# Patient Record
Sex: Male | Born: 1995 | Marital: Single | Smoking: Never smoker
Health system: Southern US, Community
[De-identification: ages and names within clinical notes are randomized; demographics above are authoritative.]

## PROBLEM LIST (undated history)

## (undated) DIAGNOSIS — E119 Type 2 diabetes mellitus without complications: Secondary | ICD-10-CM

## (undated) DIAGNOSIS — K760 Fatty (change of) liver, not elsewhere classified: Secondary | ICD-10-CM

## (undated) DIAGNOSIS — B948 Sequelae of other specified infectious and parasitic diseases: Secondary | ICD-10-CM

## (undated) DIAGNOSIS — Z98811 Dental restoration status: Secondary | ICD-10-CM

## (undated) DIAGNOSIS — T8859XA Other complications of anesthesia, initial encounter: Secondary | ICD-10-CM

## (undated) DIAGNOSIS — R479 Unspecified speech disturbances: Secondary | ICD-10-CM

## (undated) DIAGNOSIS — J45909 Unspecified asthma, uncomplicated: Secondary | ICD-10-CM

## (undated) DIAGNOSIS — R6250 Unspecified lack of expected normal physiological development in childhood: Secondary | ICD-10-CM

## (undated) DIAGNOSIS — Q999 Chromosomal abnormality, unspecified: Secondary | ICD-10-CM

## (undated) DIAGNOSIS — D8989 Other specified disorders involving the immune mechanism, not elsewhere classified: Secondary | ICD-10-CM

## (undated) DIAGNOSIS — F909 Attention-deficit hyperactivity disorder, unspecified type: Secondary | ICD-10-CM

## (undated) DIAGNOSIS — T4145XA Adverse effect of unspecified anesthetic, initial encounter: Secondary | ICD-10-CM

## (undated) DIAGNOSIS — Z9109 Other allergy status, other than to drugs and biological substances: Secondary | ICD-10-CM

## (undated) HISTORY — PX: DENTAL REHABILITATION: SHX1449

---
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
## 2020-03-19 ENCOUNTER — Ambulatory Visit: Payer: Medicaid Other | Attending: Internal Medicine

## 2020-03-19 DIAGNOSIS — Z23 Encounter for immunization: Secondary | ICD-10-CM

## 2020-03-19 NOTE — Progress Notes (Signed)
   Covid-19 Vaccination Clinic  Name:  Denzell Brungard    MRN: [id number] DOB: [birth year]  03/19/2020  Mr. Chatman-Quinn was observed post Covid-19 immunization for 15 minutes without incident. He was provided with Vaccine Information Sheet and instruction to access the V-Safe system.   Mr. Bogusz was instructed to call 911 with any severe reactions post vaccine: Marland Kitchen Difficulty breathing  . Swelling of face and throat  . A fast heartbeat  . A bad rash all over body  . Dizziness and weakness   Immunizations Administered    Name Date Dose VIS Date Route   Pfizer COVID-19 Vaccine 03/19/2020  3:54 PM 0.3 mL 01/17/2019 Intramuscular   Manufacturer: [place]   Lot: [id number]   [place]: [id number]

## 2020-04-09 ENCOUNTER — Ambulatory Visit: Payer: Self-pay

## 2020-04-12 ENCOUNTER — Ambulatory Visit: Payer: Medicaid Other | Attending: Internal Medicine

## 2020-04-12 DIAGNOSIS — Z23 Encounter for immunization: Secondary | ICD-10-CM

## 2020-04-12 NOTE — Progress Notes (Signed)
   Covid-19 Vaccination Clinic  Name:  Cristian [name]    MRN: [id number] DOB: 1996-01-19  04/12/2020  Cristian [name] was observed post Covid-19 immunization for 15 minutes without incident. He was provided with Vaccine Information Sheet and instruction to access the V-Safe system.   Cristian [name] was instructed to call 911 with any severe reactions post vaccine: Marland Kitchen Difficulty breathing  . Swelling of face and throat  . A fast heartbeat  . A bad rash all over body  . Dizziness and weakness   Immunizations Administered    Name Date Dose VIS Date Route   Pfizer COVID-19 Vaccine 04/12/2020 11:45 AM 0.3 mL 01/17/2019 Intramuscular   Manufacturer: [place]   Lot: [id number]   [place]: [id number]

## 2020-05-23 DIAGNOSIS — Z419 Encounter for procedure for purposes other than remedying health state, unspecified: Secondary | ICD-10-CM | POA: Diagnosis not present

## 2020-06-04 DIAGNOSIS — F901 Attention-deficit hyperactivity disorder, predominantly hyperactive type: Secondary | ICD-10-CM | POA: Diagnosis not present

## 2020-06-23 DIAGNOSIS — Z419 Encounter for procedure for purposes other than remedying health state, unspecified: Secondary | ICD-10-CM | POA: Diagnosis not present

## 2020-07-24 DIAGNOSIS — Z419 Encounter for procedure for purposes other than remedying health state, unspecified: Secondary | ICD-10-CM | POA: Diagnosis not present

## 2020-08-23 DIAGNOSIS — Z419 Encounter for procedure for purposes other than remedying health state, unspecified: Secondary | ICD-10-CM | POA: Diagnosis not present

## 2020-09-12 ENCOUNTER — Ambulatory Visit
Admission: EM | Admit: 2020-09-12 | Discharge: 2020-09-12 | Disposition: A | Discharge status: Home / Self Care | Payer: Medicaid Other | Attending: Family Medicine | Admitting: Family Medicine

## 2020-09-12 DIAGNOSIS — R300 Dysuria: Secondary | ICD-10-CM | POA: Diagnosis not present

## 2020-09-12 DIAGNOSIS — R1111 Vomiting without nausea: Secondary | ICD-10-CM | POA: Insufficient documentation

## 2020-09-12 DIAGNOSIS — R1032 Left lower quadrant pain: Secondary | ICD-10-CM | POA: Insufficient documentation

## 2020-09-12 LAB — POCT URINALYSIS DIP (MANUAL ENTRY)
Glucose, UA: NEGATIVE mg/dL
Leukocytes, UA: NEGATIVE
Nitrite, UA: NEGATIVE
Spec Grav, UA: 1.025 (ref 1.010–1.025)
Urobilinogen, UA: 0.2 E.U./dL
pH, UA: 6 (ref 5.0–8.0)

## 2020-09-12 MED ORDER — METHOCARBAMOL 500 MG PO TABS
500.0000 mg | ORAL_TABLET | Freq: Two times a day (BID) | ORAL | 0 refills | Status: DC
Start: 2020-09-12 — End: 2024-10-13

## 2020-09-12 MED ORDER — IBUPROFEN 600 MG PO TABS
600.0000 mg | ORAL_TABLET | Freq: Four times a day (QID) | ORAL | 0 refills | Status: AC | PRN
Start: 2020-09-12 — End: ?

## 2020-09-12 MED ORDER — TAMSULOSIN HCL 0.4 MG PO CAPS: 0.4000 mg | ORAL_CAPSULE | Freq: Every day | ORAL | 0 refills | Status: DC

## 2020-09-12 NOTE — Discharge Instructions (Addendum)
There is evidence of blood in the urine, this could indicate a kidney stone.  I have sent in flomax to take once a day  I have sent in ibuprofen to take every 4-6 hours as needed for pain  I have sent in Robaxin as needed for muscle spasms  Push fluids and get some rest.  Follow up with this office or with primary care as needed  I would continue with the stool softener, to help relieve some abdominal pressure.    We will culture the urine and be in touch with abnormal results that need further treatment  Follow up in the ER for acute worsening symptoms

## 2020-09-12 NOTE — ED Triage Notes (Signed)
Patient presents to Urgent Care with complaints of left sided abdominal pain since about a week ago. Patient's mother gave him a stool softener and it seemed to help. Past couple days the pain has come back but is more in the left side of his back and in his left groin, vomiting started this morning as well as some urinary burning. Pt is autistic, accompanied by mother.

## 2020-09-12 NOTE — ED Provider Notes (Signed)
[facility]   CC: UTI  SUBJECTIVE:  Ryan Ogborn is a 24 y.o. male who complains of urinary frequency, urgency and dysuria for the past day. Patient denies a precipitating event, recent sexual encounter, excessive caffeine intake. Localizes the pain to the lower left abdomen.  Pain is intermittent, patient cannot describe. Has hx autism. Has taken a stool softener with some pain relief. Has not tried miralax or other medications. Reports an episode of vomiting this morning as well. None since. Denies pain at this time. Symptoms are made worse with urination. Denies fever, chills, nausea, vomiting, flank pain, abnormal vaginal discharge or bleeding, hematuria.    LMP: No LMP for male patient.  ROS: As in HPI.  All other pertinent ROS negative.     Past Medical History:  Diagnosis Date  . ADHD (attention deficit hyperactivity disorder)   . Asthma   . Chromosomal abnormality   . Complication of anesthesia    Gases do not work, Fentanyl overly sedates.    . Dental crown present    top right  . Developmental delay   . Environmental allergies   . Fatty liver disease, nonalcoholic   . 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 Speech impediment    Past Surgical History:  Procedure Laterality Date  . DENTAL REHABILITATION    . MASS EXCISION Right 08/23/2015   Procedure: MINOR EXCISION OF MASS RIGHT FOREHEAD;  Surgeon: Beverly Gust, MD;  Location: [facility];  Service: ENT;  Laterality: Right;   No Known Allergies No current facility-administered medications on file prior to encounter.   Current Outpatient Medications on File Prior to Encounter  Medication Sig Dispense Refill  . amphetamine-dextroamphetamine (ADDERALL) 20 MG tablet Take 20 mg by mouth daily. 40 mg in AM, 20 mg around noon    . atomoxetine (STRATTERA) 60 MG capsule Take 60 mg by mouth daily. PM    . cetirizine  (ZYRTEC) 10 MG tablet Take 10 mg by mouth daily. PM    . Cholecalciferol (VITAMIN D PO) Take by mouth.    . metFORMIN (GLUCOPHAGE) 500 MG tablet Take by mouth 2 (two) times daily with a meal.     Social History   Socioeconomic History  . Marital status: Single    Spouse name: Not on file  . Number of children: Not on file  . Years of education: Not on file  . Highest education level: Not on file  Occupational History  . Not on file  Tobacco Use  . Smoking status: Never Smoker  . Smokeless tobacco: Never Used  . Tobacco comment: Mother smokes outside the home.  Substance and Sexual Activity  . Alcohol use: No  . Drug use: Not on file  . Sexual activity: Not on file  Other Topics Concern  . Not on file  Social History Narrative  . Not on file   Social Determinants of Health   Financial Resource Strain:   . Difficulty of Paying Living Expenses: Not on file  Food Insecurity:   . Worried About Charity fundraiser in the Last Year: Not on file  . Ran Out of Food in the Last Year: Not on file  Transportation Needs:   . Lack of Transportation (Medical): Not on file  . Lack of Transportation (Non-Medical): Not on file  Physical Activity:   . Days of Exercise per Week: Not on file  . Minutes of Exercise per Session: Not on file  Stress:   . Feeling of Stress : Not on file  Social Connections:   . Frequency of Communication with Friends and Family: Not on file  . Frequency of Social Gatherings with Friends and Family: Not on file  . Attends Religious Services: Not on file  . Active Member of Clubs or Organizations: Not on file  . Attends Archivist Meetings: Not on file  . Marital Status: Not on file  Intimate Partner Violence:   . Fear of Current or Ex-Partner: Not on file  . Emotionally Abused: Not on file  . Physically Abused: Not on file  . Sexually Abused: Not on file   Family History  Problem Relation Age of Onset  . Hypertension Mother      OBJECTIVE:  Vitals:   09/12/20 0932  BP: 136/83  Pulse: 86  Resp: 16  Temp: 97.8 F (36.6 C)  TempSrc: Oral  SpO2: 96%   General appearance: AOx3 in no acute distress HEENT: NCAT. Oropharynx clear.  Lungs: clear to auscultation bilaterally without adventitious breath sounds Heart: regular rate and rhythm. Radial pulses 2+ symmetrical bilaterally Abdomen: soft; non-distended; no tenderness; bowel sounds present; no guarding or rebound tenderness Back: no CVA tenderness Extremities: no edema; symmetrical with no gross deformities Skin: warm and dry Neurologic: Ambulates from chair to exam table without difficulty Psychological: alert and cooperative; normal mood and affect  Labs Reviewed  POCT URINALYSIS DIP (MANUAL ENTRY) - Abnormal; Notable for the following components:      Result Value   Bilirubin, UA small (*)    Ketones, POC UA small (15) (*)    Blood, UA large (*)    Protein Ur, POC trace (*)    All other components within normal limits  URINE CULTURE    ASSESSMENT & PLAN:  1. Left lower quadrant abdominal pain   2. Dysuria   3. Vomiting without nausea, intractability of vomiting not specified, unspecified vomiting type     Meds ordered this encounter  Medications  . tamsulosin (FLOMAX) 0.4 MG CAPS capsule    Sig: Take 1 capsule (0.4 mg total) by mouth daily.    Dispense:  10 capsule    Refill:  0    Order Specific Question:   Supervising Provider    Answer:   Chase Picket [id number]  . ibuprofen (ADVIL) 600 MG tablet    Sig: Take 1 tablet (600 mg total) by mouth every 6 (six) hours as needed.    Dispense:  30 tablet    Refill:  0    Order Specific Question:   Supervising Provider    Answer:   Chase Picket [id number]  . methocarbamol (ROBAXIN) 500 MG tablet    Sig: Take 1 tablet (500 mg total) by mouth 2 (two) times daily.    Dispense:  20 tablet    Refill:  0    Order Specific Question:   Supervising Provider    Answer:   Chase Picket [id number]   UA in office + for blood, ketones, bili Urine culture sent  Likely kidney stone Prescribed flomax Prescribed robaxin Prescribed ibuprofen Continue with stool softener to help relieve pressure in the abdomen We will call you with abnormal results that need further treatment Push fluids and get plenty of rest Take antibiotic as directed and to completion Take pyridium as prescribed and as needed for symptomatic relief Follow up with PCP if symptoms persists Return here or go to ER if you have any new  or worsening symptoms such as fever, worsening abdominal pain, nausea/vomiting, flank pain  Outlined signs and symptoms indicating need for more acute intervention Patient verbalized understanding After Visit Summary given     Faustino Congress, NP 09/12/20 1002

## 2020-09-14 LAB — URINE CULTURE: Culture: NO GROWTH

## 2020-09-23 DIAGNOSIS — Z419 Encounter for procedure for purposes other than remedying health state, unspecified: Secondary | ICD-10-CM | POA: Diagnosis not present

## 2020-10-23 DIAGNOSIS — Z419 Encounter for procedure for purposes other than remedying health state, unspecified: Secondary | ICD-10-CM | POA: Diagnosis not present

## 2020-11-23 DIAGNOSIS — Z419 Encounter for procedure for purposes other than remedying health state, unspecified: Secondary | ICD-10-CM | POA: Diagnosis not present

## 2020-11-25 DIAGNOSIS — K047 Periapical abscess without sinus: Secondary | ICD-10-CM | POA: Diagnosis not present

## 2020-11-25 DIAGNOSIS — H9201 Otalgia, right ear: Secondary | ICD-10-CM | POA: Diagnosis not present

## 2020-12-24 DIAGNOSIS — Z419 Encounter for procedure for purposes other than remedying health state, unspecified: Secondary | ICD-10-CM | POA: Diagnosis not present

## 2021-01-21 DIAGNOSIS — Z419 Encounter for procedure for purposes other than remedying health state, unspecified: Secondary | ICD-10-CM | POA: Diagnosis not present

## 2021-02-21 DIAGNOSIS — Z419 Encounter for procedure for purposes other than remedying health state, unspecified: Secondary | ICD-10-CM | POA: Diagnosis not present

## 2021-03-06 ENCOUNTER — Other Ambulatory Visit: Payer: Self-pay

## 2021-03-06 ENCOUNTER — Ambulatory Visit
Admission: RE | Admit: 2021-03-06 | Discharge: 2021-03-06 | Disposition: A | Discharge status: Home / Self Care | Payer: Medicaid Other | Source: Ambulatory Visit | Attending: Emergency Medicine | Admitting: Emergency Medicine

## 2021-03-06 VITALS — BP 148/97 | HR 105 | Temp 98.0°F | Resp 18

## 2021-03-06 DIAGNOSIS — J302 Other seasonal allergic rhinitis: Secondary | ICD-10-CM

## 2021-03-06 DIAGNOSIS — Z1152 Encounter for screening for COVID-19: Secondary | ICD-10-CM | POA: Diagnosis not present

## 2021-03-06 NOTE — Discharge Instructions (Addendum)
Take over-the-counter Zyrtec and use over-the-counter Flonase nasal spray as needed for allergy symptoms.    Follow up with your primary care provider if your symptoms are not improving.

## 2021-03-06 NOTE — ED Triage Notes (Signed)
Pt presents with cough and chest congestion x 1 week.  Allergies have also kicked in the last few days, making symptoms worse.  No fever.  Cough is dry.  Requesting COVID test.

## 2021-03-06 NOTE — ED Provider Notes (Signed)
[name]    CSN: [id number] Arrival date & time: 03/06/21  1409      History   Chief Complaint Chief Complaint  Patient presents with  . Cough    HPI Cristian [name] is a 25 y.o. male.   Accompanied by his mother, patient presents with 1 week history of nonproductive cough and chest congestion.  No fever, chills, rash, sore throat, shortness of breath, vomiting, diarrhea, or other symptoms.  OTC treatments attempted at home.  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  The history is provided by the patient and a parent.    Past Medical History:  Diagnosis Date  . ADHD (attention deficit hyperactivity disorder)   . Asthma   . Chromosomal abnormality   . Complication of anesthesia    Gases do not work, Fentanyl overly sedates.    . Dental crown present    top right  . Developmental delay   . Environmental allergies   . Fatty liver disease, nonalcoholic   . 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 Speech impediment     There are no problems to display for this patient.   Past Surgical History:  Procedure Laterality Date  . DENTAL REHABILITATION    . MASS EXCISION Right 08/23/2015   Procedure: MINOR EXCISION OF MASS RIGHT FOREHEAD;  Surgeon: Beverly Gust, MD;  Location: [facility];  Service: ENT;  Laterality: Right;       Home Medications    Prior to Admission medications   Medication Sig Start Date End Date Taking? Authorizing Provider  amphetamine-dextroamphetamine (ADDERALL) 20 MG tablet Take 20 mg by mouth daily. 40 mg in AM, 20 mg around noon   Yes [provider]  cetirizine (ZYRTEC) 10 MG tablet Take 10 mg by mouth daily. PM   Yes [provider]  Cholecalciferol (VITAMIN D  PO) Take by mouth.   Yes [provider]  ibuprofen (ADVIL) 600 MG tablet Take 1 tablet (600 mg total) by mouth every 6 (six) hours as needed. 09/12/20  Yes Faustino Congress, NP  metFORMIN (GLUCOPHAGE) 500 MG tablet Take by mouth 2 (two) times daily with a meal.   Yes [provider]  atomoxetine (STRATTERA) 60 MG capsule Take 60 mg by mouth daily. PM    [provider]  methocarbamol (ROBAXIN) 500 MG tablet Take 1 tablet (500 mg total) by mouth 2 (two) times daily. 09/12/20   Faustino Congress, NP  tamsulosin (FLOMAX) 0.4 MG CAPS capsule Take 1 capsule (0.4 mg total) by mouth daily. 09/12/20   Faustino Congress, NP    Family History Family History  Problem Relation Age of Onset  . Hypertension Mother     Social History Social History   Tobacco Use  . Smoking status: Never Smoker  . Smokeless tobacco: Never Used  . Tobacco comment: Mother smokes outside the home.  Substance Use Topics  . Alcohol use: No  . Drug use: Never     Allergies   Patient has no known allergies.   Review of Systems Review of Systems  Constitutional: Negative for chills and fever.  HENT: Positive for congestion. Negative for ear pain and sore throat.   Eyes: Negative for pain and visual disturbance.  Respiratory: Positive for cough. Negative for shortness of breath.   Cardiovascular: Negative for chest pain and palpitations.  Gastrointestinal: Negative for abdominal pain,  diarrhea and vomiting.  Genitourinary: Negative for dysuria and hematuria.  Musculoskeletal: Negative for arthralgias and back pain.  Skin: Negative for color change and rash.  Neurological: Negative for seizures and syncope.  All other systems reviewed and are negative.    Physical Exam Triage Vital Signs ED Triage Vitals  Enc Vitals Group     BP 03/06/21 1423 (!) 148/97     Pulse Rate 03/06/21 1423 (!) 105     Resp 03/06/21 1423 18     Temp 03/06/21 1423 98 F (36.7 C)     Temp Source  03/06/21 1423 Oral     SpO2 03/06/21 1423 98 %     Weight --      Height --      Head Circumference --      Peak Flow --      Pain Score 03/06/21 1427 0     Pain Loc --      Pain Edu? --      Excl. in College? --    No data found.  Updated Vital Signs BP (!) 148/97 (BP Location: Left Arm)   Pulse (!) 105   Temp 98 F (36.7 C) (Oral)   Resp 18   SpO2 98%   Visual Acuity Right Eye Distance:   Left Eye Distance:   Bilateral Distance:    Right Eye Near:   Left Eye Near:    Bilateral Near:     Physical Exam Vitals and nursing note reviewed.  Constitutional:      General: He is not in acute distress.    Appearance: He is well-developed. He is obese.  HENT:     Head: Normocephalic and atraumatic.     Right Ear: Tympanic membrane normal.     Left Ear: Tympanic membrane normal.     Nose: Nose normal.     Mouth/Throat:     Mouth: Mucous membranes are moist.     Pharynx: Oropharynx is clear.  Eyes:     Conjunctiva/sclera: Conjunctivae normal.  Cardiovascular:     Rate and Rhythm: Normal rate and regular rhythm.     Heart sounds: Normal heart sounds.  Pulmonary:     Effort: Pulmonary effort is normal. No respiratory distress.     Breath sounds: Normal breath sounds.  Abdominal:     Palpations: Abdomen is soft.     Tenderness: There is no abdominal tenderness.  Musculoskeletal:     Cervical back: Neck supple.  Skin:    General: Skin is warm and dry.  Neurological:     Mental Status: He is alert. Mental status is at baseline.  Psychiatric:        Mood and Affect: Mood normal.        Behavior: Behavior normal.      UC Treatments / Results  Labs (all labs ordered are listed, but only abnormal results are displayed) Labs Reviewed  NOVEL CORONAVIRUS, NAA    EKG   Radiology No results found.  Procedures Procedures (including critical care time)  Medications Ordered in UC Medications - No data to display  Initial Impression / Assessment and Plan / UC  Course  I have reviewed the triage vital signs and the nursing notes.  Pertinent labs & imaging results that were available during my care of the patient were reviewed by me and considered in my medical decision making (see chart for details).   Seasonal allergies.  Screening for COVID-19.  Instructed mother and patient to use Zyrtec and  Flonase as needed for seasonal allergy symptoms.  PCR COVID pending.  Instructed patient to self quarantine until the test result is back.  Instructed patient and his mother to follow-up with his PCP if his symptoms are not improving.  They agree to plan of care.   Final Clinical Impressions(s) / UC Diagnoses   Final diagnoses:  Encounter for screening for COVID-19  Seasonal allergies     Discharge Instructions     Take over-the-counter Zyrtec and use over-the-counter Flonase nasal spray as needed for allergy symptoms.    Follow up with your primary care provider if your symptoms are not improving.        ED Prescriptions    None     PDMP not reviewed this encounter.   Sharion Balloon, NP 03/06/21 1455

## 2021-03-08 LAB — SARS-COV-2, NAA 2 DAY TAT

## 2021-03-08 LAB — NOVEL CORONAVIRUS, NAA: SARS-CoV-2, NAA: NOT DETECTED

## 2021-03-23 DIAGNOSIS — Z419 Encounter for procedure for purposes other than remedying health state, unspecified: Secondary | ICD-10-CM | POA: Diagnosis not present

## 2021-04-07 DIAGNOSIS — R0981 Nasal congestion: Secondary | ICD-10-CM | POA: Diagnosis not present

## 2021-04-07 DIAGNOSIS — R053 Chronic cough: Secondary | ICD-10-CM | POA: Diagnosis not present

## 2021-04-07 DIAGNOSIS — J4521 Mild intermittent asthma with (acute) exacerbation: Secondary | ICD-10-CM | POA: Diagnosis not present

## 2021-04-18 DIAGNOSIS — J4530 Mild persistent asthma, uncomplicated: Secondary | ICD-10-CM | POA: Diagnosis not present

## 2021-04-18 DIAGNOSIS — Z09 Encounter for follow-up examination after completed treatment for conditions other than malignant neoplasm: Secondary | ICD-10-CM | POA: Diagnosis not present

## 2021-04-23 DIAGNOSIS — Z419 Encounter for procedure for purposes other than remedying health state, unspecified: Secondary | ICD-10-CM | POA: Diagnosis not present

## 2021-05-08 DIAGNOSIS — R6250 Unspecified lack of expected normal physiological development in childhood: Secondary | ICD-10-CM | POA: Diagnosis not present

## 2021-05-08 DIAGNOSIS — Z0000 Encounter for general adult medical examination without abnormal findings: Secondary | ICD-10-CM | POA: Diagnosis not present

## 2021-05-08 DIAGNOSIS — Z6841 Body Mass Index (BMI) 40.0 and over, adult: Secondary | ICD-10-CM | POA: Diagnosis not present

## 2021-05-08 DIAGNOSIS — J4530 Mild persistent asthma, uncomplicated: Secondary | ICD-10-CM | POA: Diagnosis not present

## 2021-05-08 DIAGNOSIS — J309 Allergic rhinitis, unspecified: Secondary | ICD-10-CM | POA: Diagnosis not present

## 2021-05-08 DIAGNOSIS — Z713 Dietary counseling and surveillance: Secondary | ICD-10-CM | POA: Diagnosis not present

## 2021-05-08 DIAGNOSIS — F902 Attention-deficit hyperactivity disorder, combined type: Secondary | ICD-10-CM | POA: Diagnosis not present

## 2021-05-23 DIAGNOSIS — Z419 Encounter for procedure for purposes other than remedying health state, unspecified: Secondary | ICD-10-CM | POA: Diagnosis not present

## 2021-06-23 DIAGNOSIS — Z419 Encounter for procedure for purposes other than remedying health state, unspecified: Secondary | ICD-10-CM | POA: Diagnosis not present

## 2021-07-19 ENCOUNTER — Other Ambulatory Visit: Payer: Self-pay

## 2021-07-19 ENCOUNTER — Ambulatory Visit
Admission: EM | Admit: 2021-07-19 | Discharge: 2021-07-19 | Disposition: A | Discharge status: Home / Self Care | Payer: Medicaid Other | Attending: Emergency Medicine | Admitting: Emergency Medicine

## 2021-07-19 ENCOUNTER — Ambulatory Visit (INDEPENDENT_AMBULATORY_CARE_PROVIDER_SITE_OTHER): Payer: Medicaid Other

## 2021-07-19 DIAGNOSIS — R059 Cough, unspecified: Secondary | ICD-10-CM

## 2021-07-19 DIAGNOSIS — R0982 Postnasal drip: Secondary | ICD-10-CM | POA: Diagnosis not present

## 2021-07-19 DIAGNOSIS — K219 Gastro-esophageal reflux disease without esophagitis: Secondary | ICD-10-CM

## 2021-07-19 HISTORY — DX: Type 2 diabetes mellitus without complications: E11.9

## 2021-07-19 MED ORDER — IPRATROPIUM BROMIDE 0.06 % NA SOLN: 2.0000 | Freq: Four times a day (QID) | NASAL | 12 refills | Status: AC

## 2021-07-19 MED ORDER — OMEPRAZOLE 20 MG PO CPDR: 20.0000 mg | DELAYED_RELEASE_CAPSULE | Freq: Every day | ORAL | 1 refills | Status: DC

## 2021-07-19 NOTE — ED Notes (Signed)
Pt ambulated to xray with radiologist, grandfather in treatment RM.

## 2021-07-19 NOTE — ED Triage Notes (Signed)
Pt with cognitive delays here with grandfather, reports non-productive cough x3 months, reports not getting better. Keeping pt up at night, unable to sleep d/t cough  Has seen PC , visited urgent care in [place], has not been able to f/u with PCP d/t no appts. ABX and inhaler treatment w/o improvement.   COVID test neg few months ago  Jon Gills is wanting pt to have a CXR.

## 2021-07-19 NOTE — ED Provider Notes (Addendum)
[facility]    CSN: [id number] Arrival date & time: 07/19/21  1052      History   Chief Complaint Chief Complaint  Patient presents with   Cough    HPI Rusten Keath is a 25 y.o. male.   HPI  25 year old male here for evaluation of respiratory complaints.  Patient presents for evaluation of a nonproductive cough that has been going on for the last 4 months.  Per patient and his grandfather they deny any other associated respiratory symptoms including fever, runny nose and nasal congestion, sore throat, ear pain or pressure, shortness of breath or wheezing.  The coughing is worse at night but patient denies any sour taste in his mouth in the morning or burning in his throat.  He has been evaluated by the urgent care in [place] where he was diagnosed with seasonal allergies and placed on Flonase and Zyrtec which have not been helping.  Patient indicates that he has been taking his medications.  He was also prescribed an inhaler and antibiotics by his primary care provider which have not resolved the issue.  He is here with his grandfather because he has a developmental delay and his grandfather is requesting a chest x-ray since he has been seen multiple times over the past 4 months and has never had any imaging of his chest.  Grandfather was able to find the antibiotic that was used and patient was placed on a Z-Pak for treatment of his cough without resolution of his symptoms in addition to a ProAir inhaler and prednisone.  Past Medical History:  Diagnosis Date   ADHD (attention deficit hyperactivity disorder)    Asthma    Chromosomal abnormality    Complication of anesthesia    Gases do not work, Fentanyl overly sedates.     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   Past Surgical History:  Procedure Laterality Date   DENTAL REHABILITATION     MASS EXCISION Right 08/23/2015   Procedure: MINOR EXCISION OF MASS RIGHT FOREHEAD;  Surgeon: Beverly Gust, MD;  Location: [place];  Service: ENT;  Laterality: Right;       Home Medications    Prior to Admission medications   Medication Sig Start Date End Date Taking? Authorizing Provider  amphetamine-dextroamphetamine (ADDERALL) 20 MG tablet Take 20 mg by mouth daily. 40 mg in AM, 20 mg around noon   Yes [provider]  cetirizine (ZYRTEC) 10 MG tablet Take 10 mg by mouth daily. PM   Yes [provider]  Cholecalciferol (VITAMIN D PO) Take by mouth.   Yes [provider]  ibuprofen (ADVIL) 600 MG tablet Take 1 tablet (600 mg total) by mouth every 6 (six) hours as needed. 09/12/20  Yes Faustino Congress, NP  ipratropium (ATROVENT) 0.06 % nasal spray Place 2 sprays into both nostrils 4 (four) times daily. 07/19/21  Yes Margarette Canada, NP  metFORMIN (GLUCOPHAGE) 500 MG tablet Take by mouth 2 (two) times daily with a meal.   Yes [provider]  omeprazole (PRILOSEC) 20 MG capsule Take 1 capsule (20 mg total) by mouth daily. 07/19/21  Yes Margarette Canada, NP  atomoxetine (STRATTERA) 60 MG capsule Take 60 mg by mouth daily. PM    [provider]  methocarbamol (ROBAXIN) 500 MG tablet Take 1 tablet (500 mg total) by mouth 2 (two) times daily. 09/12/20   Faustino Congress, NP  tamsulosin (FLOMAX) 0.4 MG CAPS capsule Take 1 capsule (0.4 mg total) by mouth daily. 09/12/20   Faustino Congress, NP    Family History Family History  Problem Relation Age of Onset   Hypertension Mother     Social History Social History   Tobacco Use   Smoking status: Never   Smokeless tobacco: Never   Tobacco comments:    Mother smokes outside  the home.  Vaping Use   Vaping Use: Never used  Substance Use Topics   Alcohol use: No   Drug use: Never     Allergies   Patient has no known allergies.   Review of Systems Review of Systems  Constitutional:  Negative for activity change, appetite change and fever.  HENT:  Negative for congestion, ear pain, postnasal drip, rhinorrhea and sore throat.   Respiratory:  Positive for cough. Negative for shortness of breath and wheezing.   Gastrointestinal:  Negative for diarrhea, nausea and vomiting.  Skin:  Negative for rash.  Hematological: Negative.   Psychiatric/Behavioral: Negative.      Physical Exam Triage Vital Signs ED Triage Vitals  Enc Vitals Group     BP      Pulse      Resp      Temp      Temp src      SpO2      Weight      Height      Head Circumference      Peak Flow      Pain Score      Pain Loc      Pain Edu?      Excl. in [place]?    No data found.  Updated Vital Signs BP (!) 152/99 (BP Location: Right Arm)   Pulse (!) 104   Temp 98.7 F (37.1 C) (Oral)   Ht '5\' 6"'$  (1.676 m)   Wt 225 lb (102.1 kg)   SpO2 96%   BMI 36.32 kg/m   Visual Acuity Right Eye Distance:   Left Eye Distance:   Bilateral Distance:    Right Eye Near:   Left Eye Near:    Bilateral Near:     Physical Exam Vitals and nursing note reviewed.  Constitutional:      General: He is not in acute distress.    Appearance: Normal appearance. He is obese. He is not ill-appearing.  HENT:     Head: Normocephalic and atraumatic.     Right Ear: Tympanic membrane, ear canal and external ear normal. There is no impacted cerumen.     Left Ear: Tympanic membrane, ear canal and external ear normal. There is no impacted cerumen.     Nose: Congestion and rhinorrhea present.     Mouth/Throat:     Mouth: Mucous membranes are moist.     Pharynx: Oropharynx is clear. Posterior oropharyngeal erythema present.  Cardiovascular:     Rate and Rhythm: Normal rate and regular rhythm.     Pulses:  Normal pulses.     Heart sounds: Normal heart sounds. No murmur heard.   No gallop.  Pulmonary:     Effort: Pulmonary effort is normal.     Breath sounds: Normal breath sounds. No wheezing, rhonchi or rales.  Musculoskeletal:  Cervical back: Normal range of motion and neck supple.  Lymphadenopathy:     Cervical: No cervical adenopathy.  Skin:    General: Skin is warm and dry.     Capillary Refill: Capillary refill takes less than 2 seconds.     Findings: No erythema or rash.  Neurological:     Mental Status: He is alert and oriented to person, place, and time.  Psychiatric:        Mood and Affect: Mood normal.        Behavior: Behavior normal.        Thought Content: Thought content normal.        Judgment: Judgment normal.     UC Treatments / Results  Labs (all labs ordered are listed, but only abnormal results are displayed) Labs Reviewed - No data to display  EKG   Radiology DG Chest 2 View  Result Date: 07/19/2021 CLINICAL DATA:  Cough. EXAM: CHEST - 2 VIEW COMPARISON:  None. FINDINGS: The heart size and mediastinal contours are within normal limits. Both lungs are clear. The visualized skeletal structures are unremarkable. IMPRESSION: No active cardiopulmonary disease. Electronically Signed   By: Marijo Conception M.D.   On: 07/19/2021 11:36    Procedures Procedures (including critical care time)  Medications Ordered in UC Medications - No data to display  Initial Impression / Assessment and Plan / UC Course  I have reviewed the triage vital signs and the nursing notes.  Pertinent labs & imaging results that were available during my care of the patient were reviewed by me and considered in my medical decision making (see chart for details).  Patient is a nontoxic-appearing 25 year old male with a history of cognitive delay here with his grandfather for evaluation of a cough that has been present for 3 to 4 months.  The cough is nonproductive.  They are denying  any other associated upper or lower respiratory symptoms.  Patient's been evaluated by [facility] urgent care and diagnosed with seasonal allergies for which she was placed on Zyrtec and Flonase.  He indicates he has been taking those medications as directed without any resolution of symptoms.  He sees [facility] who has treated him with antibiotics and inhaler without resolution of his symptoms.  Patient's grandfather is requesting a chest x-ray.  Patient's physical exam reveals pearly gray tympanic membranes bilaterally with normal light reflex and clear external auditory canals.  Nasal mucosa is markedly erythematous and edematous with clear nasal discharge.  Oropharyngeal exam reveals posterior oropharyngeal erythema with postnasal drip.  No cervical lymphadenopathy patient exam.  Cardiopulmonary exam is clear lung sounds in all fields.  Patient is denying reflux symptoms, sour taste in mouth, or sore throat in the morning though continues cough that has not responded to antibiotics and antihistamines is indicative of reflux disease.  There is some components of upper respiratory infection given the erythematous and edematous nature of the nasal mucosa.  Will obtain chest x-ray to look for acute intrathoracic process.  If chest x-ray is negative will discharge patient home on a trial of omeprazole and Pepcid.  Radiology interpretation of chest x-ray is that there is no active cardiopulmonary disease.  Suspect patient's cough is coming from accommodation of postnasal drip from his nasal congestion and also possibly reflux disease.  Will augment his allergy regimen with Atrovent nasal spray and do a trial of Prilosec and Pepcid to see if his symptoms improve.   Final Clinical Impressions(s) / UC Diagnoses  Final diagnoses:  Postnasal drip  Gastroesophageal reflux disease without esophagitis     Discharge Instructions      There was no evidence of lung infection or abnormal lung  process on your chest x-ray.  Typically, protracted coughs that have not responded to allergy treatment and antibiotics can often be the result of reflux disease.  The nasal congestion found on physical exam and the postnasal drip can also be contributing to the continued cough.  Please continue your prescribed medications, including the Zyrtec and Flonase, as previously directed.  Add on the Atrovent nasal spray, 2 squirts up each nostril 4 times a day to help cut down on nasal congestion, mucus production, and postnasal drip.  For treatment of possible reflux symptoms take omeprazole once daily at bedtime.  This will cut down acid production in the stomach and help to limit 8 acid available to reflux into the esophagus and the lungs.  You can also use over-the-counter Pepcid, 20 mg twice daily, to help with decrease of acid production and reflux symptoms.  I have given you a list of food choices to help with controlling reflux as well in your discharge papers that you may find helpful.  Follow-up with your primary care provider for any continued or worsening symptoms as you may need a referral to gastroenterology for an endoscopy.     ED Prescriptions     Medication Sig Dispense Auth. Provider   ipratropium (ATROVENT) 0.06 % nasal spray Place 2 sprays into both nostrils 4 (four) times daily. 15 mL Margarette Canada, NP   omeprazole (PRILOSEC) 20 MG capsule Take 1 capsule (20 mg total) by mouth daily. 30 capsule Margarette Canada, NP      PDMP not reviewed this encounter.   Margarette Canada, NP 07/19/21 [place]    Margarette Canada, NP 07/19/21 1152

## 2021-07-19 NOTE — Discharge Instructions (Addendum)
There was no evidence of lung infection or abnormal lung process on your chest x-ray.  Typically, protracted coughs that have not responded to allergy treatment and antibiotics can often be the result of reflux disease.  The nasal congestion found on physical exam and the postnasal drip can also be contributing to the continued cough.  Please continue your prescribed medications, including the Zyrtec and Flonase, as previously directed.  Add on the Atrovent nasal spray, 2 squirts up each nostril 4 times a day to help cut down on nasal congestion, mucus production, and postnasal drip.  For treatment of possible reflux symptoms take omeprazole once daily at bedtime.  This will cut down acid production in the stomach and help to limit 8 acid available to reflux into the esophagus and the lungs.  You can also use over-the-counter Pepcid, 20 mg twice daily, to help with decrease of acid production and reflux symptoms.  I have given you a list of food choices to help with controlling reflux as well in your discharge papers that you may find helpful.  Follow-up with your primary care provider for any continued or worsening symptoms as you may need a referral to gastroenterology for an endoscopy.

## 2021-07-24 DIAGNOSIS — Z419 Encounter for procedure for purposes other than remedying health state, unspecified: Secondary | ICD-10-CM | POA: Diagnosis not present

## 2021-08-23 DIAGNOSIS — Z419 Encounter for procedure for purposes other than remedying health state, unspecified: Secondary | ICD-10-CM | POA: Diagnosis not present

## 2021-09-23 DIAGNOSIS — Z419 Encounter for procedure for purposes other than remedying health state, unspecified: Secondary | ICD-10-CM | POA: Diagnosis not present

## 2021-10-23 DIAGNOSIS — Z419 Encounter for procedure for purposes other than remedying health state, unspecified: Secondary | ICD-10-CM | POA: Diagnosis not present

## 2021-11-23 DIAGNOSIS — Z419 Encounter for procedure for purposes other than remedying health state, unspecified: Secondary | ICD-10-CM | POA: Diagnosis not present

## 2021-12-05 ENCOUNTER — Other Ambulatory Visit: Payer: Self-pay

## 2021-12-05 ENCOUNTER — Ambulatory Visit
Admission: RE | Admit: 2021-12-05 | Discharge: 2021-12-05 | Disposition: A | Discharge status: Home / Self Care | Payer: Medicaid Other | Source: Ambulatory Visit | Attending: Medical Oncology | Admitting: Medical Oncology

## 2021-12-05 VITALS — BP 140/89 | HR 109 | Temp 98.2°F | Resp 18 | Ht 64.0 in | Wt 235.0 lb

## 2021-12-05 DIAGNOSIS — J069 Acute upper respiratory infection, unspecified: Secondary | ICD-10-CM | POA: Diagnosis not present

## 2021-12-05 MED ORDER — FLUTICASONE PROPIONATE 50 MCG/ACT NA SUSP: 2.0000 | Freq: Every day | NASAL | 0 refills | Status: AC

## 2021-12-05 MED ORDER — BENZONATATE 100 MG PO CAPS: 100.0000 mg | ORAL_CAPSULE | Freq: Three times a day (TID) | ORAL | 0 refills | Status: DC

## 2021-12-05 MED ORDER — PREDNISONE 10 MG PO TABS: ORAL_TABLET | ORAL | 0 refills | Status: DC

## 2021-12-05 MED ORDER — ALBUTEROL SULFATE HFA 108 (90 BASE) MCG/ACT IN AERS: 1.0000 | INHALATION_SPRAY | Freq: Four times a day (QID) | RESPIRATORY_TRACT | 0 refills | Status: AC | PRN

## 2021-12-05 NOTE — ED Triage Notes (Signed)
Pt here with  mom who statesC/O cough for 3 days. Nasal congestion.

## 2021-12-05 NOTE — ED Provider Notes (Signed)
[facility]    CSN: [id number] Arrival date & time: 12/05/21  1551      History   Chief Complaint Chief Complaint  Patient presents with   Cough    HPI Cristian [name] is a 26 y.o. male. Patient presents with his mother who is guardian and who provides history.   HPI  Cold Symptoms: Patient reports that they have had symptoms of dry cough and nasal congestion for the past 3 days. Symptoms are stable. They deny SOB, chest pain, fever or vomiting. They have tried OTC cough and cold medication, Vicks vapor rub for symptoms. No known sick contacts.    Past Medical History:  Diagnosis Date   ADHD (attention deficit hyperactivity disorder)    Asthma    Chromosomal abnormality    Complication of anesthesia    Gases do not work, Fentanyl overly sedates.     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   Past Surgical History:  Procedure Laterality Date   DENTAL REHABILITATION     MASS EXCISION Right 08/23/2015   Procedure: MINOR EXCISION OF MASS RIGHT FOREHEAD;  Surgeon: Beverly Gust, MD;  Location: [facility];  Service: ENT;  Laterality: Right;       Home Medications    Prior to Admission medications   Medication Sig Start Date End Date Taking? Authorizing Provider  amphetamine-dextroamphetamine (ADDERALL) 20 MG tablet Take 20 mg by mouth daily. 40 mg in AM, 20 mg around noon   Yes [provider]  atomoxetine (STRATTERA) 60 MG capsule Take 60 mg by mouth daily. PM   Yes [provider]  cetirizine (ZYRTEC) 10 MG tablet Take 10 mg by mouth daily. PM   Yes [provider]  metFORMIN (GLUCOPHAGE) 500 MG  tablet Take by mouth 2 (two) times daily with a meal.   Yes [provider]  Cholecalciferol (VITAMIN D PO) Take by mouth.    [provider]  ibuprofen (ADVIL) 600 MG tablet Take 1 tablet (600 mg total) by mouth every 6 (six) hours as needed. 09/12/20   Faustino Congress, NP  ipratropium (ATROVENT) 0.06 % nasal spray Place 2 sprays into both nostrils 4 (four) times daily. 07/19/21   Margarette Canada, NP  methocarbamol (ROBAXIN) 500 MG tablet Take 1 tablet (500 mg total) by mouth 2 (two) times daily. 09/12/20   Faustino Congress, NP  omeprazole (PRILOSEC) 20 MG capsule Take 1 capsule (20 mg total) by mouth daily. 07/19/21   Margarette Canada, NP  tamsulosin (FLOMAX) 0.4 MG CAPS capsule Take 1 capsule (0.4 mg total) by mouth daily. 09/12/20   Faustino Congress, NP    Family History Family History  Problem Relation Age of Onset   Hypertension Mother     Social History Social History   Tobacco Use   Smoking status: Never   Smokeless tobacco: Never   Tobacco comments:    Mother smokes outside the home.  Vaping Use   Vaping Use: Never used  Substance Use Topics   Alcohol use: No   Drug use: Never     Allergies   Patient has no known allergies.   Review of Systems Review of  Systems  As stated above in HPI Physical Exam Triage Vital Signs ED Triage Vitals  Enc Vitals Group     BP 12/05/21 1603 140/89     Pulse Rate 12/05/21 1603 (!) 109     Resp 12/05/21 1603 18     Temp 12/05/21 1603 98.2 F (36.8 C)     Temp src --      SpO2 12/05/21 1603 98 %     Weight 12/05/21 1602 235 lb (106.6 kg)     Height 12/05/21 1602 5\' 4"  (1.626 m)     Head Circumference --      Peak Flow --      Pain Score 12/05/21 1602 0     Pain Loc --      Pain Edu? --      Excl. in [place]? --    No data found.  Updated Vital Signs BP 140/89    Pulse (!) 109    Temp 98.2 F (36.8 C)    Resp 18    Ht 5\' 4"  (1.626 m)    Wt 235 lb (106.6 kg)    SpO2 98%    BMI 40.34 kg/m   Physical  Exam Vitals and nursing note reviewed.  Constitutional:      General: He is not in acute distress.    Appearance: He is well-developed.     Comments: Dry slightly wheeze like cough  HENT:     Head: Normocephalic and atraumatic.     Right Ear: Tympanic membrane normal.     Left Ear: Tympanic membrane normal.     Nose: Congestion and rhinorrhea present.     Mouth/Throat:     Mouth: Mucous membranes are moist.     Pharynx: Oropharynx is clear. No oropharyngeal exudate or posterior oropharyngeal erythema.  Eyes:     Conjunctiva/sclera: Conjunctivae normal.  Cardiovascular:     Rate and Rhythm: Normal rate and regular rhythm.     Heart sounds: No murmur heard. Pulmonary:     Effort: Pulmonary effort is normal. No respiratory distress.     Breath sounds: Normal breath sounds.  Abdominal:     Palpations: Abdomen is soft.     Tenderness: There is no abdominal tenderness.  Musculoskeletal:        General: No swelling.     Cervical back: Neck supple.  Lymphadenopathy:     Cervical: No cervical adenopathy.  Skin:    General: Skin is warm and dry.     Capillary Refill: Capillary refill takes less than 2 seconds.  Neurological:     Mental Status: He is alert.  Psychiatric:        Mood and Affect: Mood normal.     UC Treatments / Results  Labs (all labs ordered are listed, but only abnormal results are displayed) Labs Reviewed - No data to display  EKG   Radiology No results found.  Procedures Procedures (including critical care time)  Medications Ordered in UC Medications - No data to display  Initial Impression / Assessment and Plan / UC Course  I have reviewed the triage vital signs and the nursing notes.  Pertinent labs & imaging results that were available during my care of the patient were reviewed by me and considered in my medical decision making (see chart for details).     New.  Appears viral in nature.  Mom states that he often gets bronchitis so we have  discussed a plan of action and I am going to  send him home with a low-dose of prednisone and a refill of inhaler in case he gets an asthma exacerbation.  Also sending in [place] and Flonase.  Rest, hydration with water and over-the-counter medication as needed and directed.  Discussed red flag signs and symptoms and follow-up precautions. Follow up PRN.  Final Clinical Impressions(s) / UC Diagnoses   Final diagnoses:  None   Discharge Instructions   None    ED Prescriptions   None    PDMP not reviewed this encounter.   Hughie Closs, [place] 12/05/21 1643

## 2021-12-06 ENCOUNTER — Ambulatory Visit: Payer: Self-pay

## 2021-12-24 DIAGNOSIS — Z419 Encounter for procedure for purposes other than remedying health state, unspecified: Secondary | ICD-10-CM | POA: Diagnosis not present

## 2022-01-21 DIAGNOSIS — Z419 Encounter for procedure for purposes other than remedying health state, unspecified: Secondary | ICD-10-CM | POA: Diagnosis not present

## 2022-02-21 DIAGNOSIS — Z419 Encounter for procedure for purposes other than remedying health state, unspecified: Secondary | ICD-10-CM | POA: Diagnosis not present

## 2022-03-23 DIAGNOSIS — Z419 Encounter for procedure for purposes other than remedying health state, unspecified: Secondary | ICD-10-CM | POA: Diagnosis not present

## 2022-04-23 DIAGNOSIS — Z419 Encounter for procedure for purposes other than remedying health state, unspecified: Secondary | ICD-10-CM | POA: Diagnosis not present

## 2022-05-23 DIAGNOSIS — Z419 Encounter for procedure for purposes other than remedying health state, unspecified: Secondary | ICD-10-CM | POA: Diagnosis not present

## 2022-06-23 DIAGNOSIS — Z419 Encounter for procedure for purposes other than remedying health state, unspecified: Secondary | ICD-10-CM | POA: Diagnosis not present

## 2022-07-24 DIAGNOSIS — Z419 Encounter for procedure for purposes other than remedying health state, unspecified: Secondary | ICD-10-CM | POA: Diagnosis not present

## 2022-08-23 DIAGNOSIS — Z419 Encounter for procedure for purposes other than remedying health state, unspecified: Secondary | ICD-10-CM | POA: Diagnosis not present

## 2022-09-23 DIAGNOSIS — Z419 Encounter for procedure for purposes other than remedying health state, unspecified: Secondary | ICD-10-CM | POA: Diagnosis not present

## 2022-11-16 IMAGING — CR DG CHEST 2V
2 series · 3 of 3 positions shown · non-contrast
Comparison: None.

CLINICAL DATA: Cough.

EXAM:
CHEST - 2 VIEW

[chest pa]
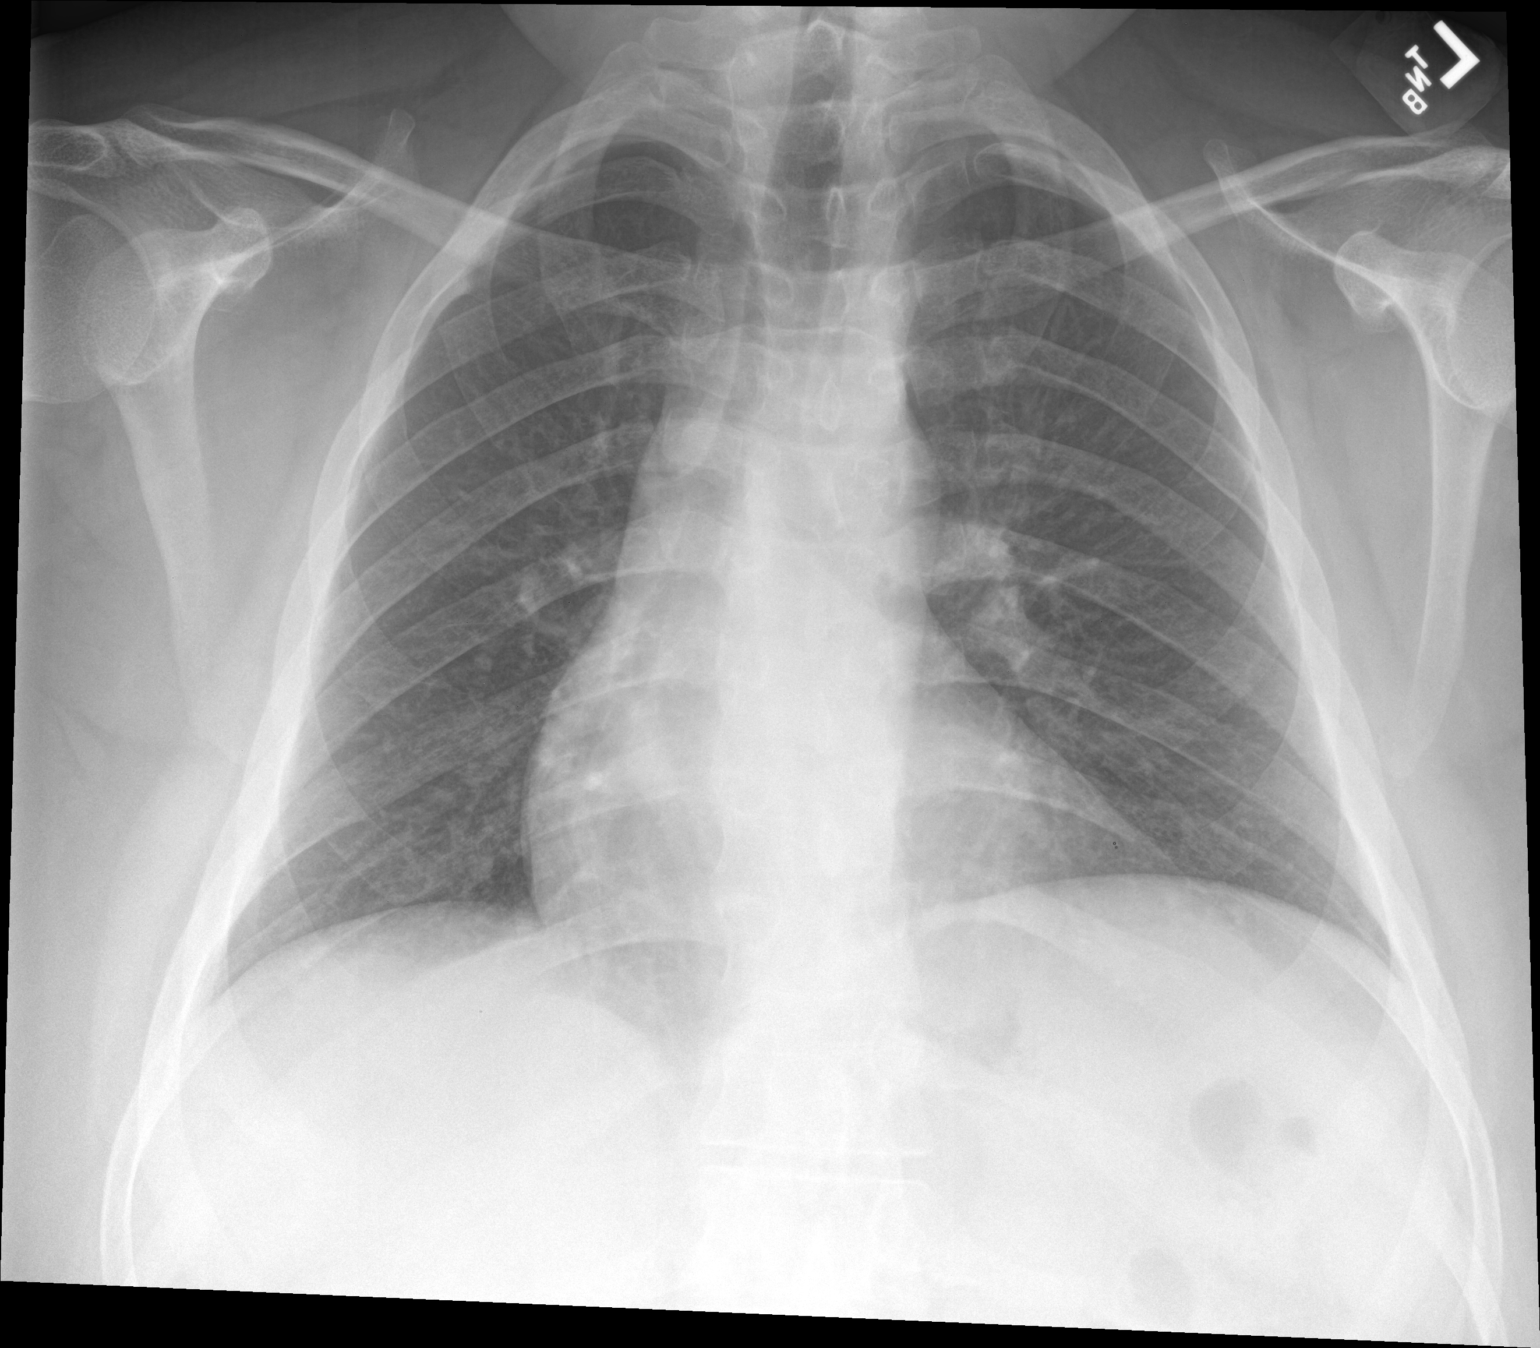

[Series 2: chest lat · 0.14mm/px · 2 of 2 slices shown]
[im 1/2]
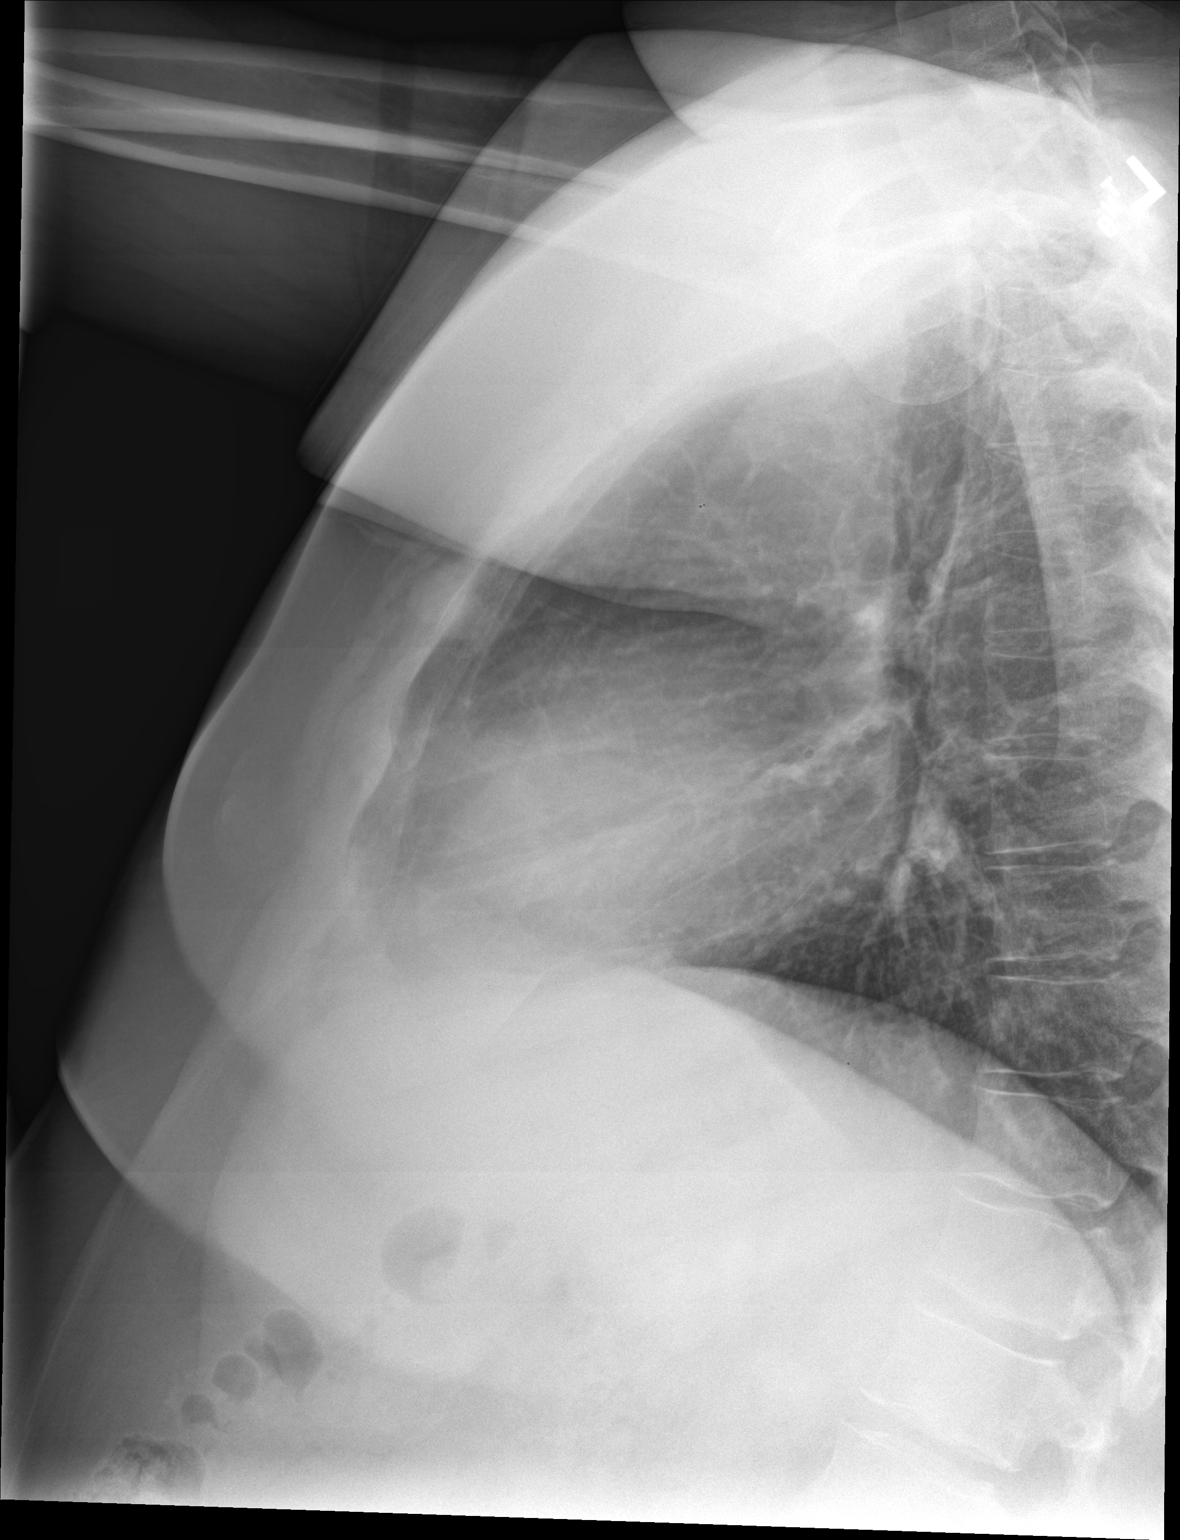
[im 2/2]
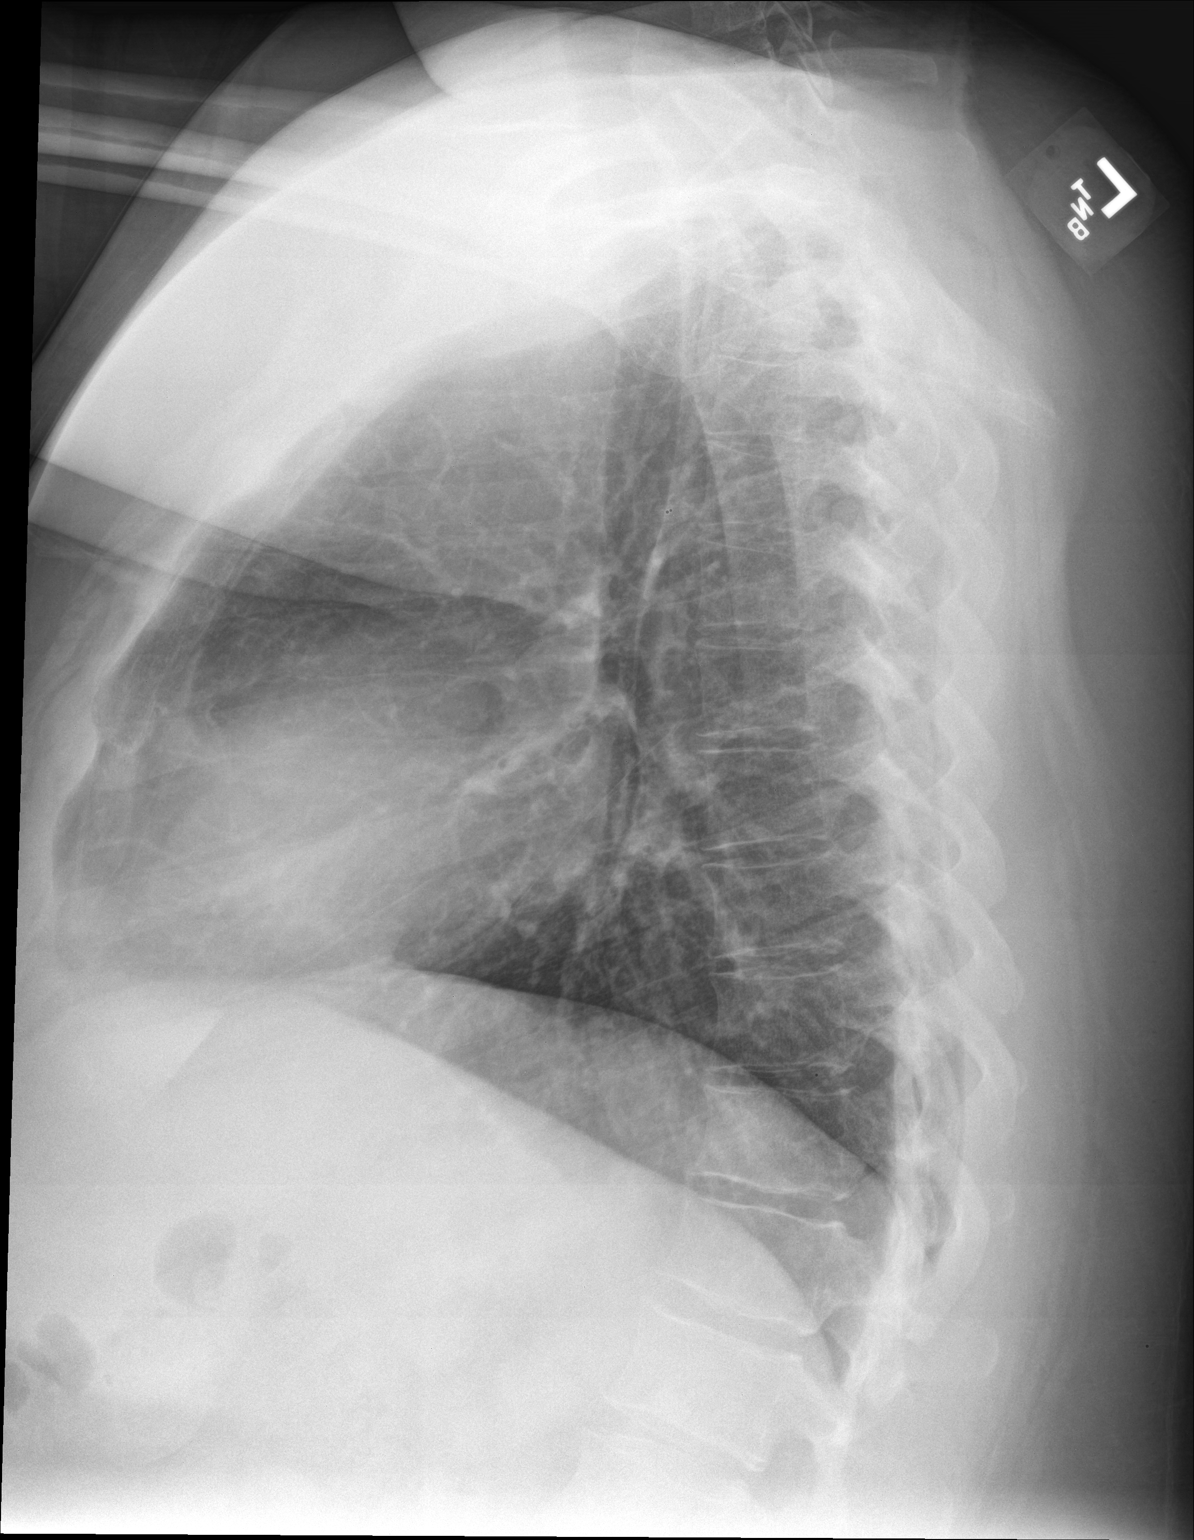

[3 of 3 positions shown; findings below may reference images not displayed]

FINDINGS: The heart size and mediastinal contours are within normal limits.
Both lungs are clear. The visualized skeletal structures are
unremarkable.
IMPRESSION: No active cardiopulmonary disease.

## 2024-10-13 ENCOUNTER — Ambulatory Visit (INDEPENDENT_AMBULATORY_CARE_PROVIDER_SITE_OTHER): Admitting: Urology

## 2024-10-13 VITALS — BP 134/87 | HR 95 | Wt 239.5 lb

## 2024-10-13 DIAGNOSIS — N3943 Post-void dribbling: Secondary | ICD-10-CM | POA: Diagnosis not present

## 2024-10-13 DIAGNOSIS — R399 Unspecified symptoms and signs involving the genitourinary system: Secondary | ICD-10-CM | POA: Diagnosis not present

## 2024-10-13 LAB — URINALYSIS, COMPLETE
Bilirubin, UA: NEGATIVE
Glucose, UA: NEGATIVE
Ketones, UA: NEGATIVE
Leukocytes,UA: NEGATIVE
Nitrite, UA: NEGATIVE
RBC, UA: NEGATIVE
Specific Gravity, UA: 1.03 (ref 1.005–1.030)
Urobilinogen, Ur: 1 mg/dL (ref 0.2–1.0)
pH, UA: 6 (ref 5.0–7.5)

## 2024-10-13 LAB — MICROSCOPIC EXAMINATION: Epithelial Cells (non renal): >10 /HPF — AB (ref 0–10)

## 2024-10-13 LAB — BLADDER SCAN AMB NON-IMAGING

## 2024-10-13 MED ORDER — CELECOXIB 200 MG PO CAPS: 200.0000 mg | ORAL_CAPSULE | Freq: Two times a day (BID) | ORAL | 0 refills | Status: DC

## 2024-10-13 NOTE — Patient Instructions (Signed)
 Decreased diet drinks and any carbonated drinks, avoid caffeine

## 2024-10-13 NOTE — Progress Notes (Signed)
   10/13/24 11:30 AM   Cristian [name] [birth year] [id number]  CC: Urinary symptoms  HPI: 28 year old male with developmental delay, here with his mother today who provides entirety of the history.  He is nonverbal in clinic today.  She reports at least a few months of some urinary frequency and intermittency.  He was treated with antibiotics by PCP which may have slightly improved some of his symptoms.  He is not having any dysuria or gross hematuria.  He has diabetes and recent hemoglobin A1c from May 2025 8.9.  He drinks primarily flavored 0-calorie drinks during the day  Urinalysis today benign, PVR normal at 0ml  PMH: Past Medical History:  Diagnosis Date   ADHD (attention deficit hyperactivity disorder)    Asthma    Chromosomal abnormality    Complication of anesthesia    Gases do not work, Fentanyl overly sedates.     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 He has never used smokeless tobacco. He reports that he does not drink alcohol and does not use drugs.  Physical Exam: BP 134/87 (BP Location: Left Arm, Patient Position: Sitting, Cuff Size: Large)   Pulse 95   Wt 239 lb 8 oz (108.6 kg)   SpO2 98%   BMI 41.11 kg/m    Constitutional:  Alert and oriented, No acute  distress. Cardiovascular: No clubbing, cyanosis, or edema. Respiratory: Normal respiratory effort, no increased work of breathing.   Laboratory Data: Reviewed, see HPI  Assessment & Plan:   28 year old male with urinary frequency, urgency and some reported intermittent stream.  History obtained entirely from mother in the setting of his developmental delay and nonverbal in clinic today.  Urinalysis and PVR normal.  We reviewed possible etiologies including pelvic floor dysfunction, bladder irritants, OAB, urethral stricture disease, and other more rare causes.  I am not sure he would tolerate cystoscopy in clinic well and would like to avoid unless absolutely necessary with worsening symptoms.  Avoid bladder irritants, specifically diet drinks Celebrex  200 mg twice daily x 10 days RTC 4 to 6 weeks symptom check  Redell Burnet, MD 10/13/2024  [facility] [address], [address] [place], [place] [address] [phone]

## 2024-11-20 ENCOUNTER — Encounter: Payer: Self-pay | Admitting: Physician Assistant

## 2024-11-20 ENCOUNTER — Ambulatory Visit (INDEPENDENT_AMBULATORY_CARE_PROVIDER_SITE_OTHER): Admitting: Physician Assistant

## 2024-11-20 ENCOUNTER — Other Ambulatory Visit: Payer: Self-pay | Admitting: Physician Assistant

## 2024-11-20 VITALS — BP 143/104 | HR 105 | Ht 65.5 in | Wt 248.6 lb

## 2024-11-20 DIAGNOSIS — R399 Unspecified symptoms and signs involving the genitourinary system: Secondary | ICD-10-CM | POA: Diagnosis not present

## 2024-11-20 MED ORDER — CELECOXIB 200 MG PO CAPS: 200.0000 mg | ORAL_CAPSULE | Freq: Two times a day (BID) | ORAL | 0 refills | Status: AC

## 2024-11-20 MED ORDER — TAMSULOSIN HCL 0.4 MG PO CAPS: 0.4000 mg | ORAL_CAPSULE | Freq: Every day | ORAL | 0 refills | Status: DC

## 2024-11-20 NOTE — Progress Notes (Signed)
 "  11/20/2024 9:31 AM   Cristian [name] Nov 28, 1995 [id number]  CC: Chief Complaint  Patient presents with   Other    Post void dribbling   HPI: Cristian [name] is a 28 y.o. male with PMH developmental delay, uncontrolled diabetes, and LUTS who presents today for symptom recheck after avoiding bladder irritants and 10 days of Celebrex  for management of LUTS including frequency, dribbling, and intermittency.  He is accompanied today by his mother, who provides the majority of HPI.   Today she reports sudden onset urinary urgency and dribbling about 2 months ago.  This was not responsive to a course of Cipro.  Dr. Francisca prescribed 10 days of Celebrex , which offered some improvement.  His symptoms have worsened in the past ~10 days off the anti-inflammatory.  Notably, he has been describing low back and suprapubic pain in the last 10 days as well.  They deny constipation.  Mom has a history of nephrolithiasis.  Mr. Torre has never been definitively diagnosed with nephrolithiasis, though he did have a questionable stone episode several years ago with severe flank pain.  Mom gave him some Flomax  in the past week that she had leftover from a prior stone episode, and she thinks it helped with his symptoms.  PMH: Past Medical History:  Diagnosis Date   ADHD (attention deficit hyperactivity disorder)    Asthma    Chromosomal abnormality    Complication of anesthesia    Gases do not work, Fentanyl overly sedates.     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 If you have any questions, ask your nurse or doctor.          albuterol  108 (90 Base) MCG/ACT inhaler Commonly known as: VENTOLIN  HFA Inhale 1-2 puffs into the lungs every 6 (six) hours as needed.   amphetamine-dextroamphetamine 20 MG tablet Commonly known as: ADDERALL Take 20 mg by mouth daily. 40 mg in AM, 20 mg around noon   atomoxetine 60 MG capsule Commonly known as: STRATTERA Take 60 mg by mouth daily. PM   celecoxib  200 MG capsule Commonly known as: CeleBREX  Take 1 capsule (200 mg total) by mouth 2 (two) times daily.   cetirizine 10 MG tablet Commonly known as: ZYRTEC Take 10 mg by mouth daily. PM   escitalopram 10 MG tablet Commonly known as: LEXAPRO Take 15 mg by mouth daily.   famotidine 40 MG tablet Commonly known as: PEPCID Take 40 mg by mouth daily.   fluticasone  110 MCG/ACT inhaler Commonly known as: FLOVENT  HFA Inhale 2 puffs into the lungs.   fluticasone  50 MCG/ACT nasal spray Commonly known as: FLONASE  Place 2 sprays into both nostrils daily.   ibuprofen  600  MG tablet Commonly known as: ADVIL  Take 1 tablet (600 mg total) by mouth every 6 (six) hours as needed.   ipratropium 0.06 % nasal spray Commonly known as: ATROVENT  Place 2 sprays into both nostrils 4 (four) times daily.   ketoconazole 2 % cream Commonly known as: NIZORAL Apply 1 Application topically 2 (two) times daily.   metFORMIN 500 MG tablet Commonly known as: GLUCOPHAGE Take by mouth 2 (two) times daily with a meal.   VITAMIN D PO Take by mouth.        Allergies:  Allergies[1]  Family History: Family History  Problem Relation Age of Onset   Hypertension Mother     Social  History:   reports that he has never smoked. He has never used smokeless tobacco. He reports that he does not drink alcohol and does not use drugs.  Physical Exam: BP (!) 143/104   Pulse (!) 105   Ht 5' 5.5 (1.664 m)   Wt 248 lb 9.6 oz (112.8 kg)   BMI 40.74 kg/m   Constitutional:  Alert and oriented, no acute distress, nontoxic appearing HEENT: [place], AT Cardiovascular: No clubbing, cyanosis, or edema Respiratory: Normal respiratory effort, no increased work of breathing Skin: No rashes, bruises or suspicious lesions Neurologic: Grossly intact, no focal deficits, moving all 4 extremities Psychiatric: Minimally verbal, normal mood and affect  Assessment & Plan:   1. Lower urinary tract symptoms (LUTS) (Primary) Sudden onset LUTS 2 months ago.  No improvement with antibiotics, though slight improvement with anti-inflammatories.  He symptoms of acutely worsened again in the last 1.5 weeks, now with low back and suprapubic pain.  Will obtain renal ultrasound to evaluate for underlying stones.  Will also resume Celebrex  and start on 1 month course of Flomax  for more of an inflammatory prostatitis picture.  I will see him back in 4 weeks for symptom recheck, if no better at that time, we will need to pursue cystoscopy.  Using shared decision making, we are trying to approach this symptomatically and defer more invasive testing as much as possible due to concerns that he may not be able to tolerate in office cystoscopy. - US  RENAL; Future - tamsulosin  (FLOMAX ) 0.4 MG CAPS capsule; Take 1 capsule (0.4 mg total) by mouth daily.  Dispense: 30 capsule; Refill: 0 - celecoxib  (CELEBREX ) 200 MG capsule; Take 1 capsule (200 mg total) by mouth 2 (two) times daily for 14 days.  Dispense: 28 capsule; Refill: 0   Return in about 4 weeks (around 12/18/2024) for Symptom recheck and RUS results.  Lucie Hones, PA-C  [facility] [address], [address] [place], [place]  [address] [phone]     [1]  Allergies Allergen Reactions   Latex    "

## 2024-11-20 NOTE — Patient Instructions (Signed)
 Ultrasound scheduling: [phone]

## 2024-12-02 ENCOUNTER — Other Ambulatory Visit: Payer: Self-pay | Admitting: Physician Assistant

## 2024-12-02 DIAGNOSIS — R399 Unspecified symptoms and signs involving the genitourinary system: Secondary | ICD-10-CM

## 2024-12-11 ENCOUNTER — Ambulatory Visit
Admission: RE | Admit: 2024-12-11 | Discharge: 2024-12-11 | Disposition: A | Discharge status: Home / Self Care | Source: Ambulatory Visit | Attending: Physician Assistant | Admitting: Physician Assistant

## 2024-12-11 DIAGNOSIS — R399 Unspecified symptoms and signs involving the genitourinary system: Secondary | ICD-10-CM | POA: Diagnosis present

## 2024-12-16 ENCOUNTER — Ambulatory Visit: Payer: Self-pay | Admitting: Urology

## 2024-12-18 ENCOUNTER — Ambulatory Visit: Admitting: Physician Assistant

## 2024-12-21 ENCOUNTER — Other Ambulatory Visit: Payer: Self-pay | Admitting: Physician Assistant

## 2024-12-21 ENCOUNTER — Ambulatory Visit (INDEPENDENT_AMBULATORY_CARE_PROVIDER_SITE_OTHER): Admitting: Physician Assistant

## 2024-12-21 DIAGNOSIS — R399 Unspecified symptoms and signs involving the genitourinary system: Secondary | ICD-10-CM | POA: Diagnosis not present

## 2024-12-21 MED ORDER — CELECOXIB 200 MG PO CAPS: 200.0000 mg | ORAL_CAPSULE | Freq: Two times a day (BID) | ORAL | 0 refills | Status: AC

## 2024-12-21 MED ORDER — TAMSULOSIN HCL 0.4 MG PO CAPS: 0.4000 mg | ORAL_CAPSULE | Freq: Every day | ORAL | 0 refills | Status: AC

## 2024-12-21 NOTE — Progress Notes (Signed)
 "  12/21/2024 2:26 PM   [name] [birth year] [id number]  CC: Chief Complaint  Patient presents with   Other    Lower urinary tract symptoms (LUTS)    HPI: Cristian [name] is a 29 y.o. male with PMH developmental delay, uncontrolled diabetes, and LUTS with a family history of nephrolithiasis who presents today for follow-up and renal ultrasound results on Flomax  and Celebrex . He is accompanied today by his mother, Delon, who provides the majority of history.  Renal ultrasound dated 12/11/2024 showed no hydronephrosis or shadowing stones.  Today Mom reports his symptoms improved somewhat on Flomax  and Celebrex . She has noticed less post-void dribbling. He continues to describe some dysuria and lower abdominal pain. No constipation.  Mom recalls that he previously had rather high tolerance to lower doses of Valium and may have hallucinated at higher doses.  PVR   PMH: Past Medical History:  Diagnosis Date   ADHD (attention deficit hyperactivity disorder)    Asthma    Chromosomal abnormality    Complication of anesthesia    Gases do not work, Fentanyl overly sedates.     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 If you have any questions, ask your nurse or doctor.           albuterol  108 (90 Base) MCG/ACT inhaler Commonly known as: VENTOLIN  HFA Inhale 1-2 puffs into the lungs every 6 (six) hours as needed.   amphetamine-dextroamphetamine 20 MG tablet Commonly known as: ADDERALL Take 20 mg by mouth daily. 40 mg in AM, 20 mg around noon   atomoxetine 60 MG capsule Commonly known as: STRATTERA Take 60 mg by mouth daily. PM   cetirizine 10 MG tablet Commonly known as: ZYRTEC Take 10 mg by mouth daily. PM   escitalopram 10 MG tablet Commonly known as: LEXAPRO Take 15 mg by mouth daily.   famotidine 40 MG tablet Commonly known as: PEPCID Take 40 mg by mouth daily.   fluticasone  110 MCG/ACT inhaler Commonly known as: FLOVENT  HFA Inhale 2 puffs into the lungs.   fluticasone  50 MCG/ACT nasal spray Commonly known as: FLONASE  Place 2 sprays into both nostrils daily.   ibuprofen  600 MG tablet Commonly known as: ADVIL  Take 1 tablet (600 mg total) by mouth every 6 (six) hours as needed.   ipratropium 0.06 % nasal spray Commonly known as: ATROVENT  Place 2 sprays into both nostrils 4 (four) times daily.   ketoconazole 2 % cream Commonly known as: NIZORAL Apply 1 Application topically 2 (two) times daily.   metFORMIN 500 MG 24 hr tablet Commonly known as: GLUCOPHAGE-XR Take 500 mg by mouth 2 (two) times daily. What changed: Another medication with the same name was  removed. Continue taking this medication, and follow the directions you see here.   Ozempic (1 MG/DOSE) 4 MG/3ML Sopn Generic drug: Semaglutide (1 MG/DOSE) Inject 1 mg into the skin once a week.   tamsulosin  0.4 MG Caps capsule Commonly known as: FLOMAX  Take 1 capsule (0.4 mg total) by mouth daily.   VITAMIN D PO Take by mouth.        Allergies:  Allergies[1]  Family History: Family History  Problem Relation Age of Onset   Hypertension Mother     Social History:   reports that he has never smoked. He has never used smokeless tobacco. He reports that he  does not drink alcohol and does not use drugs.  Physical Exam: BP 133/87   Pulse 75   Ht 5' 5 (1.651 m)   Wt 237 lb (107.5 kg)   BMI 39.44 kg/m   Constitutional:  Alert and oriented, no acute distress, nontoxic appearing HEENT: [place], AT Cardiovascular: No clubbing, cyanosis, or edema Respiratory: Normal respiratory effort, no increased work of breathing Skin: No rashes, bruises or suspicious lesions Neurologic: Grossly intact, no focal deficits, moving all 4 extremities Psychiatric: Normal mood and affect  Assessment & Plan:   1. Lower urinary tract symptoms (LUTS) Slight improvement with Flomax  and Celebrex . There are concerns about his ability to tolerate in-office cysto, which I think are reasonable. I also think premedication may be less effective for him and he is at risk for paradoxical reaction to Valium. We will continue Flomax  and Celebrex  for now and see him back for symptom recheck in a month, considering cysto at that time if no better. - tamsulosin  (FLOMAX ) 0.4 MG CAPS capsule; Take 1 capsule (0.4 mg total) by mouth daily.  Dispense: 30 capsule; Refill: 0 - celecoxib  (CELEBREX ) 200 MG capsule; Take 1 capsule (200 mg total) by mouth 2 (two) times daily for 28 days.  Dispense: 56 capsule; Refill: 0   Return in about 4 weeks (around 01/18/2025) for Symptom recheck.  Lucie Hones, PA-C  [facility] [address], [address] [place], [place] [address] [phone]      [1]  Allergies Allergen Reactions   Latex    "

## 2025-01-19 ENCOUNTER — Ambulatory Visit: Admitting: Physician Assistant
